# Patient Record
Sex: Male | Born: 1990 | Race: Black or African American | Hispanic: No | Marital: Single | Smoking: Never smoker
Health system: Southern US, Community
[De-identification: ages and names within clinical notes are randomized; demographics above are authoritative.]

---
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
## 2013-03-12 IMAGING — CR DG HAND COMPLETE 3+V*L*
3 series · 3 of 3 positions shown · non-contrast
Comparison: None.

CLINICAL DATA: Hand injury

LEFT HAND - COMPLETE 3+ VIEW

[view not recorded (1 of 3)]
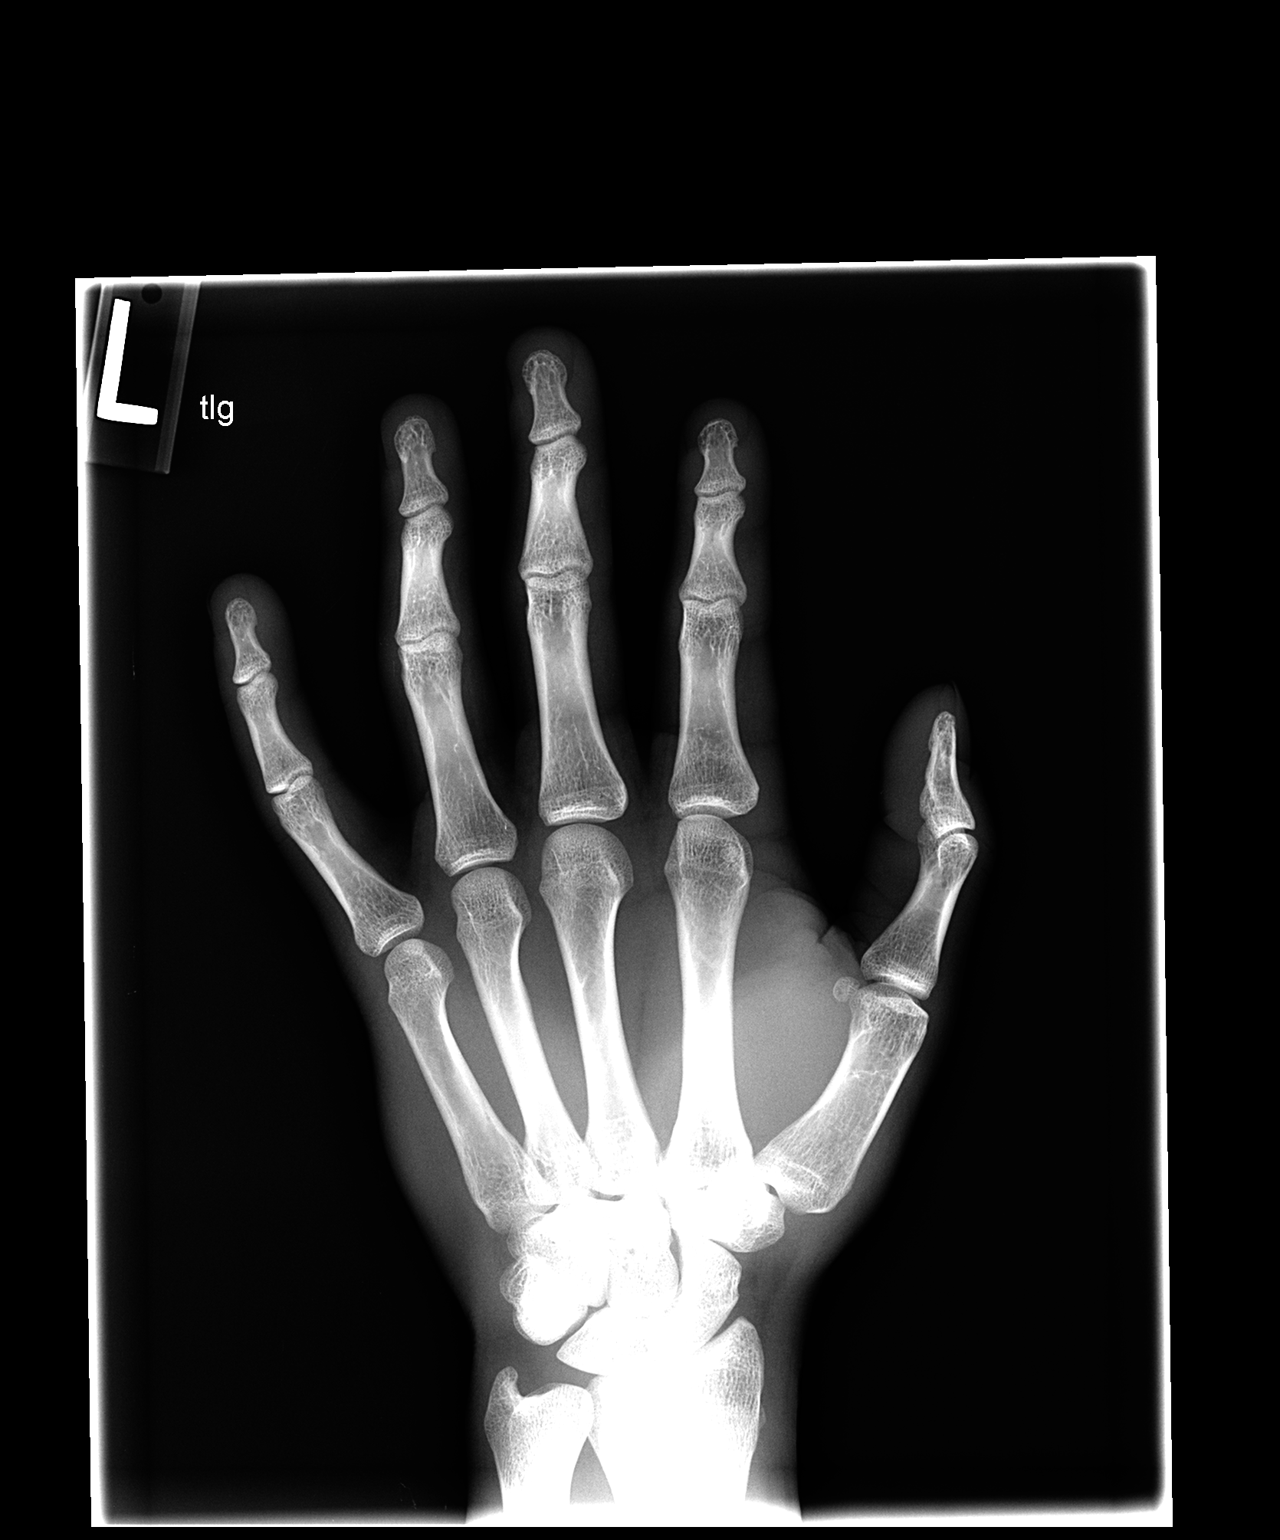

[view not recorded (2 of 3)]
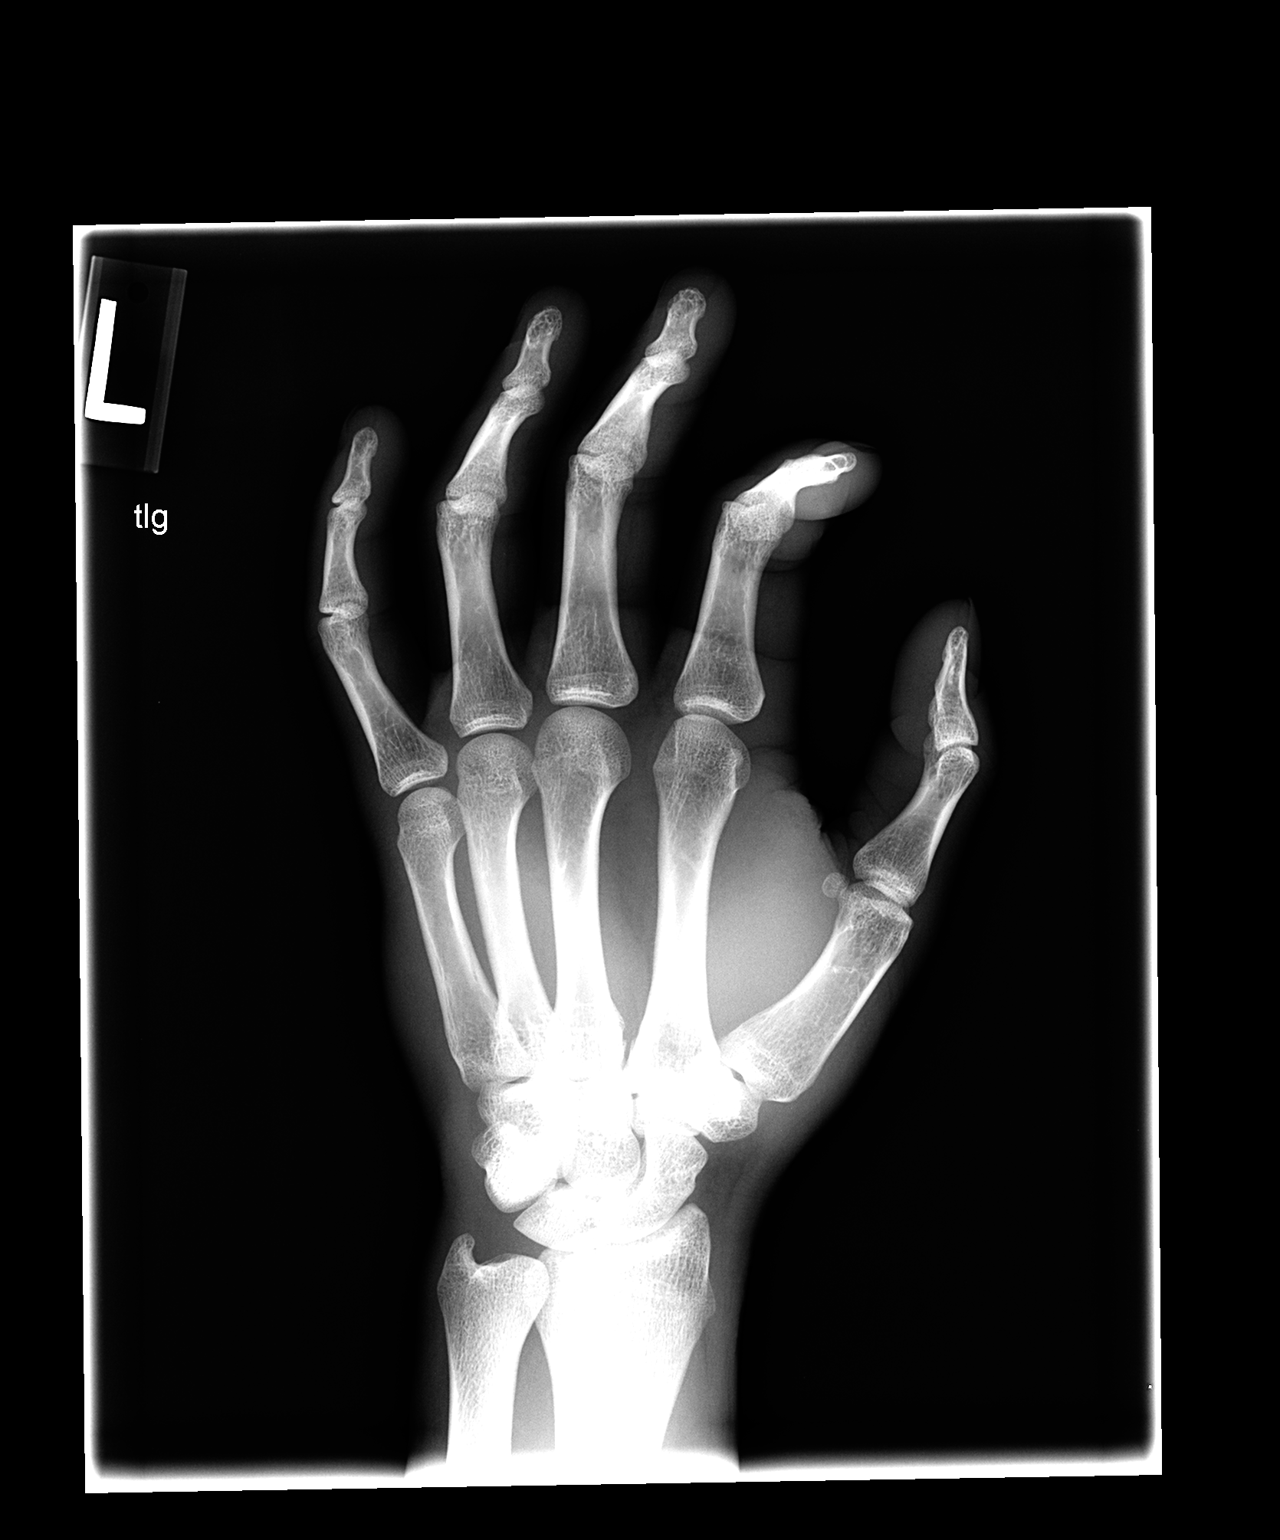

[view not recorded (3 of 3)]
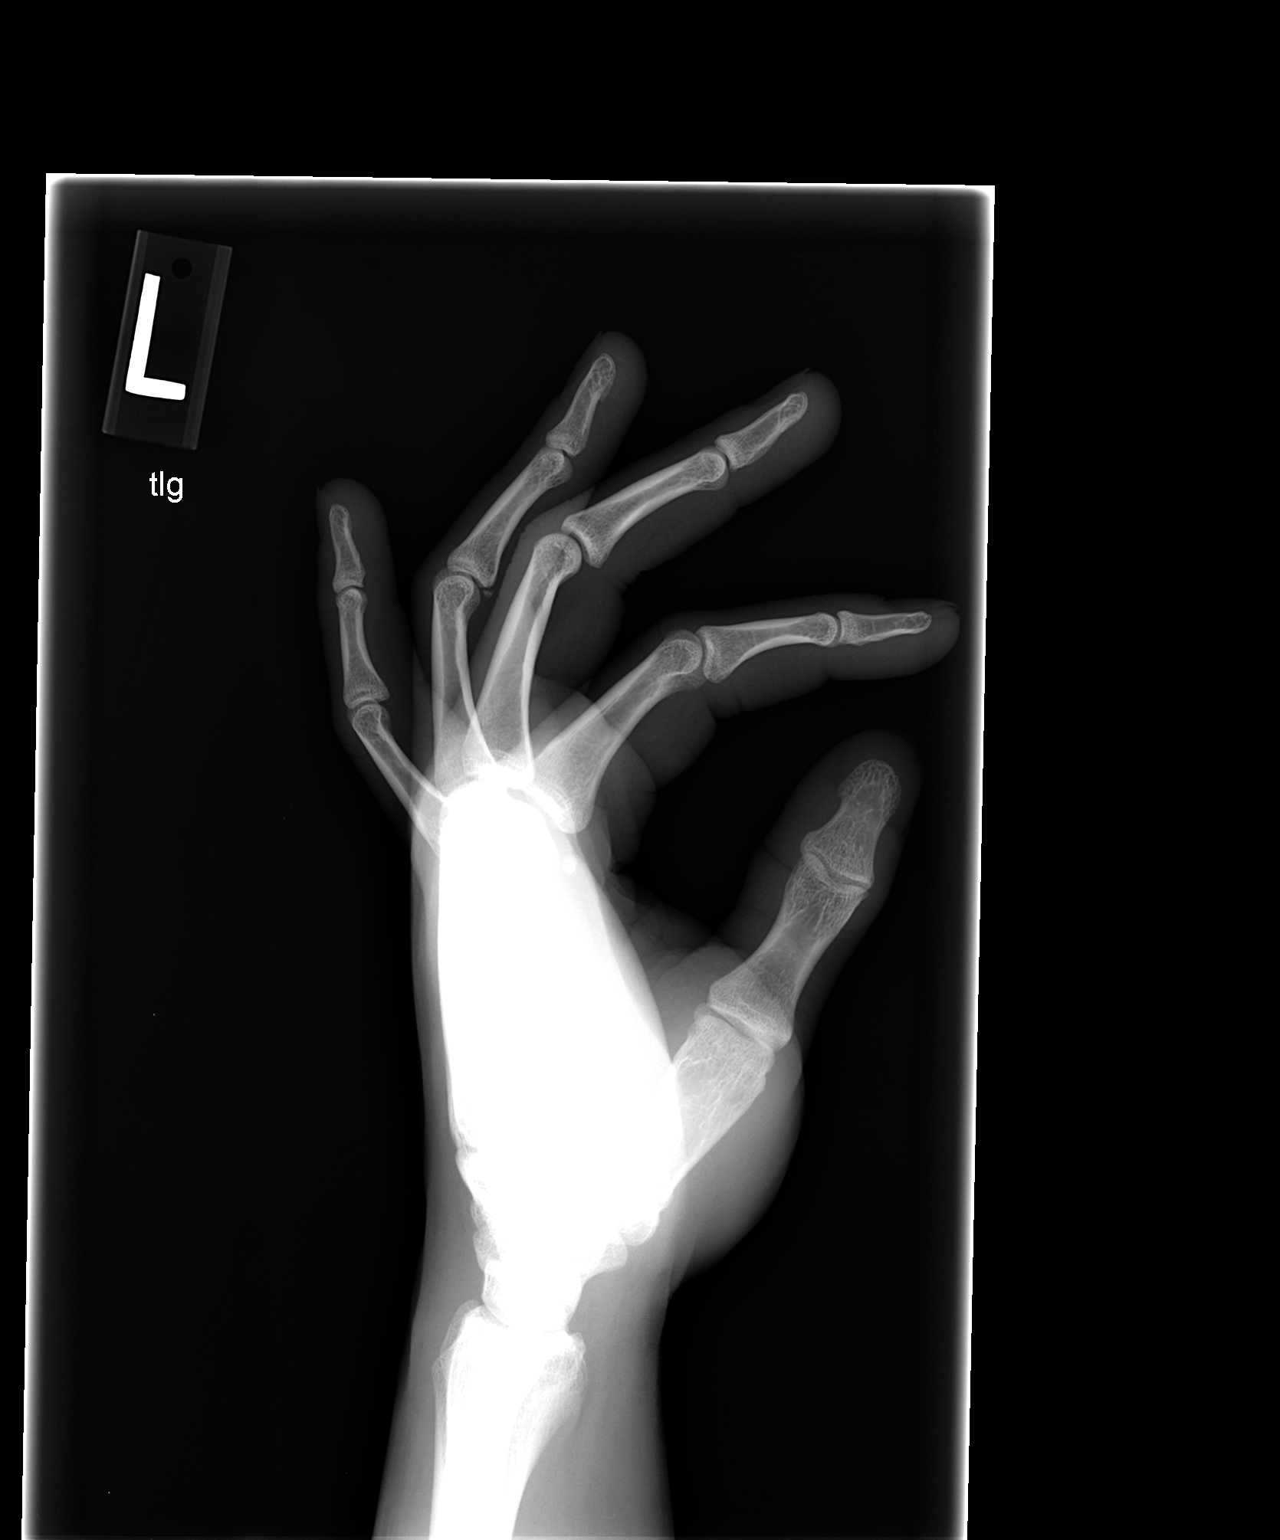

[3 of 3 positions shown; findings below may reference images not displayed]

FINDINGS: No displaced fracture dislocation of the left hand.
There is a small ossific fragment inferior to the proximal
interphalangeal joint of the fourth digit.
IMPRESSION: Small age indeterminate avulsion fracture at the proximal
interphalangeal joint of the fourth digit.  Recommend clinical
correlation for acuity.

## 2013-11-27 ENCOUNTER — Ambulatory Visit: Payer: BC Managed Care – PPO | Attending: Orthopedic Surgery | Admitting: Physical Therapy

## 2013-11-27 DIAGNOSIS — IMO0001 Reserved for inherently not codable concepts without codable children: Secondary | ICD-10-CM | POA: Insufficient documentation

## 2013-11-27 DIAGNOSIS — M25669 Stiffness of unspecified knee, not elsewhere classified: Secondary | ICD-10-CM | POA: Insufficient documentation

## 2013-11-27 DIAGNOSIS — M25569 Pain in unspecified knee: Secondary | ICD-10-CM | POA: Insufficient documentation

## 2013-11-28 ENCOUNTER — Ambulatory Visit: Payer: BC Managed Care – PPO | Admitting: Physical Therapy

## 2013-12-02 ENCOUNTER — Ambulatory Visit: Payer: BC Managed Care – PPO | Admitting: Physical Therapy

## 2013-12-04 ENCOUNTER — Ambulatory Visit: Payer: BC Managed Care – PPO | Admitting: Physical Therapy

## 2013-12-09 ENCOUNTER — Ambulatory Visit: Payer: BC Managed Care – PPO | Admitting: Physical Therapy

## 2013-12-11 ENCOUNTER — Ambulatory Visit: Payer: BC Managed Care – PPO | Admitting: Physical Therapy

## 2013-12-17 ENCOUNTER — Ambulatory Visit: Payer: BC Managed Care – PPO | Admitting: Physical Therapy

## 2013-12-19 ENCOUNTER — Ambulatory Visit: Payer: BC Managed Care – PPO | Admitting: Physical Therapy

## 2013-12-23 ENCOUNTER — Ambulatory Visit: Payer: BC Managed Care – PPO | Attending: Orthopedic Surgery | Admitting: Physical Therapy

## 2013-12-23 DIAGNOSIS — M25569 Pain in unspecified knee: Secondary | ICD-10-CM | POA: Diagnosis not present

## 2013-12-23 DIAGNOSIS — M25669 Stiffness of unspecified knee, not elsewhere classified: Secondary | ICD-10-CM | POA: Diagnosis not present

## 2013-12-23 DIAGNOSIS — IMO0001 Reserved for inherently not codable concepts without codable children: Secondary | ICD-10-CM | POA: Diagnosis not present

## 2013-12-25 ENCOUNTER — Ambulatory Visit: Payer: BC Managed Care – PPO | Admitting: Physical Therapy

## 2013-12-30 ENCOUNTER — Ambulatory Visit: Payer: BC Managed Care – PPO | Admitting: Physical Therapy

## 2014-01-01 ENCOUNTER — Ambulatory Visit: Payer: BC Managed Care – PPO | Admitting: Physical Therapy

## 2015-07-12 ENCOUNTER — Emergency Department (HOSPITAL_COMMUNITY)
Admission: EM | Admit: 2015-07-12 | Discharge: 2015-07-12 | Disposition: A | Discharge status: Home / Self Care | Payer: BLUE CROSS/BLUE SHIELD | Attending: Emergency Medicine | Admitting: Emergency Medicine

## 2015-07-12 ENCOUNTER — Encounter (HOSPITAL_COMMUNITY): Payer: Self-pay | Admitting: Nurse Practitioner

## 2015-07-12 DIAGNOSIS — Y9389 Activity, other specified: Secondary | ICD-10-CM | POA: Diagnosis not present

## 2015-07-12 DIAGNOSIS — M542 Cervicalgia: Secondary | ICD-10-CM

## 2015-07-12 DIAGNOSIS — M549 Dorsalgia, unspecified: Secondary | ICD-10-CM

## 2015-07-12 DIAGNOSIS — S299XXA Unspecified injury of thorax, initial encounter: Secondary | ICD-10-CM | POA: Diagnosis not present

## 2015-07-12 DIAGNOSIS — R519 Headache, unspecified: Secondary | ICD-10-CM

## 2015-07-12 DIAGNOSIS — Y998 Other external cause status: Secondary | ICD-10-CM | POA: Insufficient documentation

## 2015-07-12 DIAGNOSIS — R51 Headache: Secondary | ICD-10-CM

## 2015-07-12 DIAGNOSIS — S0990XA Unspecified injury of head, initial encounter: Secondary | ICD-10-CM | POA: Diagnosis not present

## 2015-07-12 DIAGNOSIS — Y92410 Unspecified street and highway as the place of occurrence of the external cause: Secondary | ICD-10-CM | POA: Insufficient documentation

## 2015-07-12 DIAGNOSIS — S199XXA Unspecified injury of neck, initial encounter: Secondary | ICD-10-CM | POA: Diagnosis not present

## 2015-07-12 MED ORDER — IBUPROFEN 800 MG PO TABS
800.0000 mg | ORAL_TABLET | Freq: Once | ORAL | Status: AC
  Administered 2015-07-12: 800 mg via ORAL
  Filled 2015-07-12: qty 1

## 2015-07-12 MED ORDER — IBUPROFEN 800 MG PO TABS: 800.0000 mg | ORAL_TABLET | Freq: Three times a day (TID) | ORAL | Status: AC | PRN

## 2015-07-12 MED ORDER — DIAZEPAM 5 MG PO TABS: 5.0000 mg | ORAL_TABLET | Freq: Four times a day (QID) | ORAL | Status: AC | PRN

## 2015-07-12 MED ORDER — DIAZEPAM 5 MG PO TABS
5.0000 mg | ORAL_TABLET | Freq: Once | ORAL | Status: AC
Start: 2015-07-12 — End: 2015-07-12
  Administered 2015-07-12: 5 mg via ORAL
  Filled 2015-07-12: qty 1

## 2015-07-12 NOTE — ED Notes (Signed)
PT DISCHARGED. INSTRUCTIONS AND PRESCRIPTIONS GIVEN. AAOX3. PT IN NO APPARENT DISTRESS. THE OPPORTUNITY TO ASK QUESTIONS WAS PROVIDED. 
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
